# Patient Record
Sex: Male | Born: 1985 | Race: White | Hispanic: No | Marital: Single | Smoking: Former smoker
Health system: Southern US, Community
[De-identification: ages and names within clinical notes are randomized; demographics above are authoritative.]

## PROBLEM LIST (undated history)

## (undated) HISTORY — PX: ORBITAL FRACTURE SURGERY: SHX725

---

## 2014-09-08 ENCOUNTER — Emergency Department: Payer: Self-pay | Admitting: Emergency Medicine

## 2014-09-13 ENCOUNTER — Emergency Department: Payer: Self-pay | Admitting: Emergency Medicine

## 2014-11-26 ENCOUNTER — Ambulatory Visit
Admission: RE | Admit: 2014-11-26 | Discharge: 2014-11-26 | Disposition: A | Discharge status: Home / Self Care | Payer: Medicaid Other | Source: Ambulatory Visit | Attending: Internal Medicine | Admitting: Internal Medicine

## 2014-11-26 ENCOUNTER — Other Ambulatory Visit: Payer: Self-pay | Admitting: Internal Medicine

## 2014-11-26 DIAGNOSIS — R918 Other nonspecific abnormal finding of lung field: Secondary | ICD-10-CM

## 2014-11-30 ENCOUNTER — Other Ambulatory Visit: Payer: Self-pay | Admitting: Family Medicine

## 2014-11-30 DIAGNOSIS — R945 Abnormal results of liver function studies: Principal | ICD-10-CM

## 2014-11-30 DIAGNOSIS — R7989 Other specified abnormal findings of blood chemistry: Secondary | ICD-10-CM

## 2014-12-03 ENCOUNTER — Ambulatory Visit
Admission: RE | Admit: 2014-12-03 | Discharge: 2014-12-03 | Disposition: A | Discharge status: Home / Self Care | Payer: Medicaid Other | Source: Ambulatory Visit | Attending: Family Medicine | Admitting: Family Medicine

## 2014-12-03 DIAGNOSIS — R945 Abnormal results of liver function studies: Secondary | ICD-10-CM

## 2014-12-03 DIAGNOSIS — R109 Unspecified abdominal pain: Secondary | ICD-10-CM | POA: Insufficient documentation

## 2014-12-03 DIAGNOSIS — R7989 Other specified abnormal findings of blood chemistry: Secondary | ICD-10-CM | POA: Insufficient documentation

## 2014-12-22 ENCOUNTER — Emergency Department: Payer: Medicaid Other

## 2014-12-22 ENCOUNTER — Encounter: Payer: Self-pay | Admitting: Urgent Care

## 2014-12-22 DIAGNOSIS — Y998 Other external cause status: Secondary | ICD-10-CM | POA: Insufficient documentation

## 2014-12-22 DIAGNOSIS — Y9234 Swimming pool (public) as the place of occurrence of the external cause: Secondary | ICD-10-CM | POA: Insufficient documentation

## 2014-12-22 DIAGNOSIS — W231XXA Caught, crushed, jammed, or pinched between stationary objects, initial encounter: Secondary | ICD-10-CM | POA: Insufficient documentation

## 2014-12-22 DIAGNOSIS — Y9311 Activity, swimming: Secondary | ICD-10-CM | POA: Insufficient documentation

## 2014-12-22 DIAGNOSIS — S62645A Nondisplaced fracture of proximal phalanx of left ring finger, initial encounter for closed fracture: Secondary | ICD-10-CM | POA: Insufficient documentation

## 2014-12-22 DIAGNOSIS — Z720 Tobacco use: Secondary | ICD-10-CM | POA: Insufficient documentation

## 2014-12-22 NOTE — ED Notes (Signed)
Patient presents with c/o RIGHT hand pain. Patient reporting that he was swimming with his kid and one went under the water, states, "I dove in and I hit my hand. Now I cant bend it." (+) swelling.

## 2014-12-23 ENCOUNTER — Telehealth: Payer: Self-pay | Admitting: Family Medicine

## 2014-12-23 ENCOUNTER — Emergency Department
Admission: EM | Admit: 2014-12-23 | Discharge: 2014-12-23 | Disposition: A | Discharge status: Home / Self Care | Payer: Medicaid Other | Attending: Emergency Medicine | Admitting: Emergency Medicine

## 2014-12-23 ENCOUNTER — Encounter: Payer: Self-pay | Admitting: Urgent Care

## 2014-12-23 ENCOUNTER — Emergency Department
Admission: EM | Admit: 2014-12-23 | Discharge: 2014-12-23 | Disposition: A | Discharge status: Home / Self Care | Payer: Medicaid Other | Source: Home / Self Care | Attending: Emergency Medicine | Admitting: Emergency Medicine

## 2014-12-23 DIAGNOSIS — S62604A Fracture of unspecified phalanx of right ring finger, initial encounter for closed fracture: Secondary | ICD-10-CM

## 2014-12-23 DIAGNOSIS — T391X5A Adverse effect of 4-Aminophenol derivatives, initial encounter: Secondary | ICD-10-CM

## 2014-12-23 DIAGNOSIS — T50905A Adverse effect of unspecified drugs, medicaments and biological substances, initial encounter: Secondary | ICD-10-CM

## 2014-12-23 DIAGNOSIS — Z9119 Patient's noncompliance with other medical treatment and regimen: Secondary | ICD-10-CM

## 2014-12-23 DIAGNOSIS — Z789 Other specified health status: Secondary | ICD-10-CM

## 2014-12-23 DIAGNOSIS — Z720 Tobacco use: Secondary | ICD-10-CM

## 2014-12-23 DIAGNOSIS — S62609A Fracture of unspecified phalanx of unspecified finger, initial encounter for closed fracture: Secondary | ICD-10-CM

## 2014-12-23 MED ORDER — ONDANSETRON 8 MG PO TBDP
ORAL_TABLET | ORAL | Status: AC
  Administered 2014-12-23: 8 mg via ORAL
  Filled 2014-12-23: qty 1

## 2014-12-23 MED ORDER — ONDANSETRON 4 MG PO TBDP: 4.0000 mg | ORAL_TABLET | Freq: Three times a day (TID) | ORAL | Status: DC | PRN

## 2014-12-23 MED ORDER — GI COCKTAIL ~~LOC~~
30.0000 mL | Freq: Once | ORAL | Status: AC
  Administered 2014-12-23: 30 mL via ORAL

## 2014-12-23 MED ORDER — OXYCODONE-ACETAMINOPHEN 5-325 MG PO TABS
1.0000 | ORAL_TABLET | Freq: Once | ORAL | Status: AC
  Administered 2014-12-23: 1 via ORAL

## 2014-12-23 MED ORDER — OXYCODONE-ACETAMINOPHEN 5-325 MG PO TABS
ORAL_TABLET | ORAL | Status: AC
  Administered 2014-12-23: 1 via ORAL
  Filled 2014-12-23: qty 1

## 2014-12-23 MED ORDER — GI COCKTAIL ~~LOC~~
ORAL | Status: AC
  Filled 2014-12-23: qty 30

## 2014-12-23 MED ORDER — ONDANSETRON 8 MG PO TBDP
8.0000 mg | ORAL_TABLET | Freq: Once | ORAL | Status: AC
  Administered 2014-12-23: 8 mg via ORAL

## 2014-12-23 MED ORDER — OXYCODONE-ACETAMINOPHEN 5-325 MG PO TABS: 1.0000 | ORAL_TABLET | ORAL | Status: DC | PRN

## 2014-12-23 NOTE — ED Provider Notes (Signed)
[facility] Emergency Department Provider Note  ____________________________________________  Time seen: 6:40 AM  I have reviewed the triage vital signs and the nursing notes.   HISTORY  Chief Complaint Abdominal Pain      HPI Joseph Hayden is a 29 y.o. male presents with epigastric pain and one episodes of vomiting after taking Percocet     History reviewed. No pertinent past medical history.  There are no active problems to display for this patient.   Past Surgical History  Procedure Laterality Date  . Orbital fracture surgery Left     metal plate    Current Outpatient Rx  Name  Route  Sig  Dispense  Refill  . oxyCODONE-acetaminophen (PERCOCET/ROXICET) 5-325 MG per tablet   Oral   Take 1 tablet by mouth every 4 (four) hours as needed for severe pain.   12 tablet   0     Allergies Hydrocodone  No family history on file.  Social History History  Substance Use Topics  . Smoking status: Current Every Day Smoker  . Smokeless tobacco: Not on file  . Alcohol Use: Yes    Review of Systems  Constitutional: Negative for fever. Eyes: Negative for visual changes. ENT: Negative for sore throat. Cardiovascular: Negative for chest pain. Respiratory: Negative for shortness of breath. Gastrointestinal: Positive for abdominal pain, vomiting  Genitourinary: Negative for dysuria. Musculoskeletal: Negative for back pain. Skin: Negative for rash. Neurological: Negative for headaches, focal weakness or numbness.   10-point ROS otherwise negative.  ____________________________________________   PHYSICAL EXAM:  VITAL SIGNS: ED Triage Vitals  Enc Vitals Group     BP 12/23/14 0550 164/109 mmHg     Pulse Rate 12/23/14 0550 71     Resp --      Temp 12/23/14 0550 97.6 F (36.4 C)     Temp Source 12/23/14 0550 Oral     SpO2 12/23/14 0550 97 %     Weight 12/23/14 0550 230 lb (104.327 kg)     Height 12/23/14 0550 6\' 2"  (1.88 m)   Head Cir --      Peak Flow --      Pain Score 12/23/14 0550 10     Pain Loc --      Pain Edu? --      Excl. in GC? --      Constitutional: Alert and oriented. Well appearing and in no distress. Eyes: Conjunctivae are normal. PERRL. Normal extraocular movements. ENT   Head: Normocephalic and atraumatic.   Nose: No congestion/rhinnorhea.   Mouth/Throat: Mucous membranes are moist.   Neck: No stridor. Hematological/Lymphatic/Immunilogical: No cervical lymphadenopathy. Cardiovascular: Normal rate, regular rhythm. Normal and symmetric distal pulses are present in all extremities. No murmurs, rubs, or gallops. Respiratory: Normal respiratory effort without tachypnea nor retractions. Breath sounds are clear and equal bilaterally. No wheezes/rales/rhonchi. Gastrointestinal: Positive epigastric tenderness with palpation No distention. There is no CVA tenderness. Genitourinary: deferred Musculoskeletal: Nontender with normal range of motion in all extremities. No joint effusions.  No lower extremity tenderness nor edema. Neurologic:  Normal speech and language. No gross focal neurologic deficits are appreciated. Speech is normal.  Skin:  Skin is warm, dry and intact. No rash noted. Psychiatric: Mood and affect are normal. Speech and behavior are normal. Patient exhibits appropriate insight and judgment.  ____________________________________________        INITIAL IMPRESSION / ASSESSMENT AND PLAN / ED COURSE  Pertinent labs & imaging results that were available during my care of the patient were  reviewed by me and considered in my medical decision making (see chart for details).  History of physical exam consistent with possible GI upset secondary to medication (Percocet). Patient given GI cocktail in the emergency department improvement in symptoms  ____________________________________________   FINAL CLINICAL IMPRESSION(S) / ED DIAGNOSES  Final diagnoses:  Medication  intolerance      Darci Current, MD 12/23/14 [phone]

## 2014-12-23 NOTE — Telephone Encounter (Signed)
Pt. Was seen at [facility]  12/23/2014 with a fracture to the 4th finger on the right hand. He needs to get a referral to Ortho due to [employer] does he need to be seen?

## 2014-12-23 NOTE — Discharge Instructions (Signed)

## 2014-12-23 NOTE — ED Provider Notes (Signed)
[facility] Emergency Department Provider Note  ____________________________________________  Time seen: 3:00 AM  I have reviewed the triage vital signs and the nursing notes.   HISTORY  Chief Complaint Hand Pain     HPI Joseph Hayden is a 29 y.o. male presents with right hand pain currently 7 out of 10 and swelling since yesterday. Patient states he dove into the pole and struck his right hand on the bottom of the pool     History reviewed. No pertinent past medical history.  There are no active problems to display for this patient.   Past Surgical History  Procedure Laterality Date  . Orbital fracture surgery Left     metal plate    No current outpatient prescriptions on file.  Allergies Hydrocodone  No family history on file.  Social History History  Substance Use Topics  . Smoking status: Current Every Day Smoker  . Smokeless tobacco: Not on file  . Alcohol Use: Yes    Review of Systems  Constitutional: Negative for fever. Eyes: Negative for visual changes. ENT: Negative for sore throat. Cardiovascular: Negative for chest pain. Respiratory: Negative for shortness of breath. Gastrointestinal: Negative for abdominal pain, vomiting and diarrhea. Genitourinary: Negative for dysuria. Musculoskeletal: Negative for back pain. Skin: Negative for rash. Neurological: Negative for headaches, focal weakness or numbness.   10-point ROS otherwise negative.  ____________________________________________   PHYSICAL EXAM:  VITAL SIGNS: ED Triage Vitals  Enc Vitals Group     BP 12/22/14 2324 136/87 mmHg     Pulse Rate 12/22/14 2324 94     Resp 12/22/14 2324 18     Temp 12/22/14 2324 98.6 F (37 C)     Temp Source 12/22/14 2324 Oral     SpO2 12/22/14 2324 97 %     Weight 12/22/14 2324 230 lb (104.327 kg)     Height 12/22/14 2324 6\' 2"  (1.88 m)     Head Cir --      Peak Flow --      Pain Score 12/22/14 2325 8     Pain Loc --       Pain Edu? --      Excl. in GC? --      Constitutional: Alert and oriented. Well appearing and in no distress. Eyes: Conjunctivae are normal. PERRL. Normal extraocular movements. ENT   Head: Normocephalic and atraumatic.   Nose: No congestion/rhinnorhea.   Mouth/Throat: Mucous membranes are moist.   Neck: No stridor. Hematological/Lymphatic/Immunilogical: No cervical lymphadenopathy. Cardiovascular: Normal rate, regular rhythm. Normal and symmetric distal pulses are present in all extremities. No murmurs, rubs, or gallops. Respiratory: Normal respiratory effort without tachypnea nor retractions. Breath sounds are clear and equal bilaterally. No wheezes/rales/rhonchi. Gastrointestinal: Soft and nontender. No distention. There is no CVA tenderness. Genitourinary: deferred Musculoskeletal: Positive right fourth finger pain and swelling Neurologic:  Normal speech and language. No gross focal neurologic deficits are appreciated. Speech is normal.  Skin:  Skin is warm, dry and intact. No rash noted. Psychiatric: Mood and affect are normal. Speech and behavior are normal. Patient exhibits appropriate insight and judgment.  ____________________________________________      RADIOLOGY  Right hand x-ray revealed:  IMPRESSION: 1. Acute nondisplaced intra-articular fracture through the base of the right fourth middle phalanx with overlying soft tissue swelling. 2. Additional tiny osseous density at the base of the right fourth proximal phalanx, suspicious for additional tiny avulsion fracture. ____________________________________________   PROCEDURES  Procedure(s) performed: Aluminum and foam splint applied to the  patient's right fourth finger    ____________________________________________   INITIAL IMPRESSION / ASSESSMENT AND PLAN / ED COURSE  Pertinent labs & imaging results that were available during my care of the patient were reviewed by me and considered in  my medical decision making (see chart for details).  History physical exam and right hand x-ray consistent with right fourth finger fracture. Such aluminum foam splint applied to the right fourth finger patient will be referred to orthopedist  ____________________________________________   FINAL CLINICAL IMPRESSION(S) / ED DIAGNOSES  Final diagnoses:  Fracture of fourth finger, right, closed, initial encounter      Darci Current, MD 12/23/14 2359

## 2014-12-23 NOTE — Discharge Instructions (Signed)

## 2014-12-23 NOTE — ED Notes (Signed)
Patient presents with c/o stabbing pain to his upper abd. Of note, patient was seen here earlier and given Percocet. Patient questioning reaction. (+) N/V. Patient has had Percocet in the past without issues.

## 2015-02-02 ENCOUNTER — Encounter: Payer: Self-pay | Admitting: Family Medicine

## 2015-02-02 ENCOUNTER — Ambulatory Visit (INDEPENDENT_AMBULATORY_CARE_PROVIDER_SITE_OTHER): Payer: Self-pay | Admitting: Family Medicine

## 2015-02-02 VITALS — BP 140/82 | HR 80 | Ht 74.0 in | Wt 246.0 lb

## 2015-02-02 DIAGNOSIS — M545 Low back pain, unspecified: Secondary | ICD-10-CM

## 2015-02-02 DIAGNOSIS — E559 Vitamin D deficiency, unspecified: Secondary | ICD-10-CM

## 2015-02-02 DIAGNOSIS — M25561 Pain in right knee: Secondary | ICD-10-CM

## 2015-02-02 DIAGNOSIS — R7401 Elevation of levels of liver transaminase levels: Secondary | ICD-10-CM

## 2015-02-02 DIAGNOSIS — G8929 Other chronic pain: Secondary | ICD-10-CM

## 2015-02-02 DIAGNOSIS — H6983 Other specified disorders of Eustachian tube, bilateral: Secondary | ICD-10-CM

## 2015-02-02 DIAGNOSIS — S82002A Unspecified fracture of left patella, initial encounter for closed fracture: Secondary | ICD-10-CM

## 2015-02-02 DIAGNOSIS — R740 Nonspecific elevation of levels of transaminase and lactic acid dehydrogenase [LDH]: Secondary | ICD-10-CM

## 2015-02-02 MED ORDER — HYDROCODONE-ACETAMINOPHEN 5-325 MG PO TABS: 1.0000 | ORAL_TABLET | ORAL | Status: DC | PRN

## 2015-02-03 DIAGNOSIS — M25561 Pain in right knee: Secondary | ICD-10-CM

## 2015-02-03 DIAGNOSIS — M545 Low back pain, unspecified: Secondary | ICD-10-CM | POA: Insufficient documentation

## 2015-02-03 DIAGNOSIS — H6980 Other specified disorders of Eustachian tube, unspecified ear: Secondary | ICD-10-CM | POA: Insufficient documentation

## 2015-02-03 DIAGNOSIS — R740 Nonspecific elevation of levels of transaminase and lactic acid dehydrogenase [LDH]: Secondary | ICD-10-CM

## 2015-02-03 DIAGNOSIS — E559 Vitamin D deficiency, unspecified: Secondary | ICD-10-CM | POA: Insufficient documentation

## 2015-02-03 DIAGNOSIS — G8929 Other chronic pain: Secondary | ICD-10-CM | POA: Insufficient documentation

## 2015-02-03 DIAGNOSIS — R7401 Elevation of levels of liver transaminase levels: Secondary | ICD-10-CM | POA: Insufficient documentation

## 2015-02-03 NOTE — Progress Notes (Signed)
Date:  02/02/2015   Name:  Joseph Hayden   DOB:  07/13/1985   MRN:  [id number]  PCP:  Schuyler Amor, MD    Chief Complaint: Knee Pain   History of Present Illness:  This is a 29 y.o. male who reports sustaining medial L patella fx 01/19/15 while in [place] pit at concert in [place]. Seen next day and dx made on Xray, placed in knee immobilizer, still can't walk without immobilizer. Sees Dedra Skeens for finger injury, saw yesterday, says he needs referral to see for knee (confirmed with his office). Taking ibuprofen tid but not helping with pain, given hydrocodone at time of injury which helped and he tolerated well (allergy to hydrocodone listed). Also says Flonase NS has not helped his intermittent ear pain. Has frequent sneezing and watery eyes.  Review of Systems:  Review of Systems  Patient Active Problem List   Diagnosis Date Noted  . Vitamin D deficiency 02/03/2015  . ETD (eustachian tube dysfunction) 02/03/2015    Prior to Admission medications   Medication Sig Start Date End Date Taking? Authorizing Provider  ibuprofen (ADVIL,MOTRIN) 800 MG tablet Take 1 tablet by mouth every 8 (eight) hours as needed. 12/28/14  Yes Historical Provider, MD  traMADol (ULTRAM) 50 MG tablet Take 1 tablet by mouth every 6 (six) hours as needed. 01/27/15  Yes Historical Provider, MD  Cholecalciferol (VITAMIN D3) 5000 UNITS TABS Take 1 tablet by mouth daily.    Historical Provider, MD  HYDROcodone-acetaminophen (NORCO/VICODIN) 5-325 MG per tablet Take 1 tablet by mouth every 4 (four) hours as needed. 02/02/15   Schuyler Amor, MD  Turmeric POWD Take 1 tablet by mouth daily.    Historical Provider, MD    No Active Allergies  Past Surgical History  Procedure Laterality Date  . Orbital fracture surgery Left     metal plate    History  Substance Use Topics  . Smoking status: Former Games developer  . Smokeless tobacco: Not on file  . Alcohol Use: 0.0 oz/week    0 Standard drinks or equivalent per week     History reviewed. No pertinent family history.  Medication list has been reviewed and updated.  Physical Examination: BP 140/82 mmHg  Pulse 80  Ht  (1.88 m)  Wt 246 lb (111.585 kg)  BMI 31.57 kg/m2  Physical Exam  Constitutional: He appears well-developed and well-nourished. No distress.  HENT:  B TM's retracted, L > R  Musculoskeletal:  L knee in immobilizer    Assessment and Plan:  1. Fx patella, left, closed, initial encounter Persistent disability, refer ortho, Vicodin short-term only for pain - Ambulatory referral to Orthopedic Surgery - HYDROcodone-acetaminophen (NORCO/VICODIN) 5-325 MG per tablet; Take 1 tablet by mouth every 4 (four) hours as needed.  Dispense: 30 tablet; Refill: 0  2. ETD (eustachian tube dysfunction), bilateral Likely related to AR, trial OTC Claritin or Zyrtec for 2 weeks  3. Vitamin D deficiency Continue supplementation - Cholecalciferol (VITAMIN D3) 5000 UNITS TABS; Take 1 tablet by mouth daily.  4. Elevated ALT measurement Abdominal US negative, consider repeat next visit  5. Chronic low back pain  6. Chronic pain of right knee   No Follow-up on file.  [name]. Kingsley Spittle MD [facility]  02/03/2015

## 2015-02-09 ENCOUNTER — Other Ambulatory Visit: Payer: Self-pay | Admitting: Orthopedic Surgery

## 2015-02-09 DIAGNOSIS — M25562 Pain in left knee: Secondary | ICD-10-CM

## 2015-02-09 DIAGNOSIS — S8392XA Sprain of unspecified site of left knee, initial encounter: Secondary | ICD-10-CM

## 2015-02-09 DIAGNOSIS — M2392 Unspecified internal derangement of left knee: Secondary | ICD-10-CM

## 2015-02-15 ENCOUNTER — Ambulatory Visit: Payer: Medicaid Other

## 2015-03-26 ENCOUNTER — Emergency Department
Admission: EM | Admit: 2015-03-26 | Discharge: 2015-03-26 | Disposition: A | Discharge status: Home / Self Care | Payer: Self-pay | Attending: Student | Admitting: Student

## 2015-03-26 ENCOUNTER — Encounter: Payer: Self-pay | Admitting: Emergency Medicine

## 2015-03-26 DIAGNOSIS — Z87891 Personal history of nicotine dependence: Secondary | ICD-10-CM | POA: Insufficient documentation

## 2015-03-26 DIAGNOSIS — Y9389 Activity, other specified: Secondary | ICD-10-CM | POA: Insufficient documentation

## 2015-03-26 DIAGNOSIS — M792 Neuralgia and neuritis, unspecified: Secondary | ICD-10-CM | POA: Insufficient documentation

## 2015-03-26 DIAGNOSIS — S80912A Unspecified superficial injury of left knee, initial encounter: Secondary | ICD-10-CM | POA: Insufficient documentation

## 2015-03-26 DIAGNOSIS — Y998 Other external cause status: Secondary | ICD-10-CM | POA: Insufficient documentation

## 2015-03-26 DIAGNOSIS — W4909XA Other specified item causing external constriction, initial encounter: Secondary | ICD-10-CM | POA: Insufficient documentation

## 2015-03-26 DIAGNOSIS — Z79899 Other long term (current) drug therapy: Secondary | ICD-10-CM | POA: Insufficient documentation

## 2015-03-26 DIAGNOSIS — T300 Burn of unspecified body region, unspecified degree: Secondary | ICD-10-CM

## 2015-03-26 DIAGNOSIS — Y9289 Other specified places as the place of occurrence of the external cause: Secondary | ICD-10-CM | POA: Insufficient documentation

## 2015-03-26 MED ORDER — NYSTATIN-TRIAMCINOLONE 100000-0.1 UNIT/GM-% EX OINT: 1.0000 "application " | TOPICAL_OINTMENT | Freq: Two times a day (BID) | CUTANEOUS | Status: DC

## 2015-03-26 MED ORDER — PREDNISONE 10 MG (21) PO TBPK: ORAL_TABLET | ORAL | Status: DC

## 2015-03-26 NOTE — ED Provider Notes (Signed)
[facility] Emergency Department Provider Note ____________________________________________  Time seen: Approximately 12:11 PM  I have reviewed the triage vital signs and the nursing notes.   HISTORY  Chief Complaint Hand Pain   HPI Joseph Hayden is a 29 y.o. male who presents to the emergency department for evaluation of right hand pain. No specific injury. Symptoms started about 2 days ago and are worse. Described as a "shocking shooting" pain. No relief with ibuprofen. 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 He is also having left knee pain and wears a velcro brace daily. He now has a rash under the brace.  History reviewed. No pertinent past medical history.  Patient Active Problem List   Diagnosis Date Noted  . Vitamin D deficiency 02/03/2015  . ETD (eustachian tube dysfunction) 02/03/2015  . Elevated ALT measurement 02/03/2015  . Chronic low back pain 02/03/2015  . Chronic pain of right knee 02/03/2015    Past Surgical History  Procedure Laterality Date  . Orbital fracture surgery Left     metal plate    Current Outpatient Rx  Name  Route  Sig  Dispense  Refill  . Cholecalciferol (VITAMIN D3) 5000 UNITS TABS   Oral   Take 1 tablet by mouth daily.         Marland Kitchen HYDROcodone-acetaminophen (NORCO/VICODIN) 5-325 MG per tablet   Oral   Take 1 tablet by mouth every 4 (four) hours as needed.   30 tablet   0   . ibuprofen (ADVIL,MOTRIN) 800 MG tablet   Oral   Take 1 tablet by mouth every 8 (eight) hours as needed.         . traMADol (ULTRAM) 50 MG tablet   Oral   Take 1 tablet by mouth every 6 (six) hours as needed.         . Turmeric POWD   Oral   Take 1 tablet by mouth daily.           Allergies Vicodin  No family history on file.  Social History Social History  Substance Use Topics  . Smoking status: Former Games developer  . Smokeless tobacco: None  . Alcohol Use: 0.0 oz/week    0 Standard drinks or  equivalent per week    Review of Systems Constitutional: No recent illness. Eyes: No visual changes. ENT: No sore throat. Cardiovascular: Denies chest pain or palpitations. Respiratory: Denies shortness of breath. Gastrointestinal: No abdominal pain.  Genitourinary: Negative for dysuria. Musculoskeletal: Pain in right hand and left knee. Skin: Negative for rash. Neurological: Negative for headaches, focal weakness or numbness. 10-point ROS otherwise negative.  ____________________________________________   PHYSICAL EXAM:  VITAL SIGNS: ED Triage Vitals  Enc Vitals Group     BP 03/26/15 1149 153/90 mmHg     Pulse Rate 03/26/15 1149 76     Resp 03/26/15 1149 18     Temp 03/26/15 1149 97.5 F (36.4 C)     Temp Source 03/26/15 1149 Oral     SpO2 03/26/15 1149 97 %     Weight 03/26/15 1149 240 lb (108.863 kg)     Height 03/26/15 1149  (1.88 m)     Head Cir --      Peak Flow --      Pain Score 03/26/15 1149 7     Pain Loc --      Pain Edu? --      Excl. in GC? --     Constitutional:  Alert and oriented. Well appearing and in no acute distress. Eyes: Conjunctivae are normal. EOMI. Head: Atraumatic. Nose: No congestion/rhinnorhea. Neck: No stridor.  Respiratory: Normal respiratory effort.   Musculoskeletal: Grip strength decreased in the right hand 4/5; and left is 5/5. Normal ROM of right wrist without pain. Neurologic:  Normal speech and language. No gross focal neurologic deficits are appreciated. Speech is normal. No gait instability. 2 point discrimination normal. Skin:  Skin is warm, dry and intact. Atraumatic. Macular, erythematous area to the medial aspect of the left leg in the shape of the Velcro knee brace. Psychiatric: Mood and affect are normal. Speech and behavior are normal.  ____________________________________________   LABS (all labs ordered are listed, but only abnormal results are displayed)  Labs Reviewed - No data to  display ____________________________________________  RADIOLOGY  Not indicated. ____________________________________________   PROCEDURES  Procedure(s) performed: None   ____________________________________________   INITIAL IMPRESSION / ASSESSMENT AND PLAN / ED COURSE  Pertinent labs & imaging results that were available during my care of the patient were reviewed by me and considered in my medical decision making (see chart for details).  Patient was advised to take the prednisone as prescribed. He was advised to stop wearing the knee brace if possible. He was advised to purchase a different knee brace if he must have it at all. He was advised to keep the area as dry as possible. He will receive a prescription for nystatin/triamcinolone ointment to cover for any Candida however the area appears as friction burn from the brace itself. ____________________________________________   FINAL CLINICAL IMPRESSION(S) / ED DIAGNOSES  Final diagnoses:  Neuropathic pain  Friction burn       Chinita Pester, FNP 03/26/15 1446  Gayla Doss, MD 03/26/15 1530

## 2015-03-26 NOTE — Discharge Instructions (Signed)
Please change your knee brace to something that is more breathable. Remove the brace as often as possible. You should discuss the knee issues with orthopedics. Return to the ER for symptoms that change or worsen if unable to schedule an appointment.

## 2015-03-26 NOTE — ED Notes (Signed)
White spot noted on each finger and tips reddened, denies being aware of injury but states does work on [employer].

## 2015-04-02 ENCOUNTER — Emergency Department
Admission: EM | Admit: 2015-04-02 | Discharge: 2015-04-02 | Disposition: A | Discharge status: Home / Self Care | Payer: Self-pay | Attending: Emergency Medicine | Admitting: Emergency Medicine

## 2015-04-02 ENCOUNTER — Encounter: Payer: Self-pay | Admitting: Emergency Medicine

## 2015-04-02 DIAGNOSIS — L02413 Cutaneous abscess of right upper limb: Secondary | ICD-10-CM | POA: Insufficient documentation

## 2015-04-02 DIAGNOSIS — Z79899 Other long term (current) drug therapy: Secondary | ICD-10-CM | POA: Insufficient documentation

## 2015-04-02 DIAGNOSIS — Z87891 Personal history of nicotine dependence: Secondary | ICD-10-CM | POA: Insufficient documentation

## 2015-04-02 DIAGNOSIS — L03113 Cellulitis of right upper limb: Secondary | ICD-10-CM | POA: Insufficient documentation

## 2015-04-02 DIAGNOSIS — L0291 Cutaneous abscess, unspecified: Secondary | ICD-10-CM

## 2015-04-02 LAB — CBC
HEMATOCRIT: 44.9 % (ref 40.0–52.0)
HEMOGLOBIN: 15.5 g/dL (ref 13.0–18.0)
MCH: 32.5 pg (ref 26.0–34.0)
MCHC: 34.6 g/dL (ref 32.0–36.0)
MCV: 94.2 fL (ref 80.0–100.0)
Platelets: 211 10*3/uL (ref 150–440)
RBC: 4.76 MIL/uL (ref 4.40–5.90)
RDW: 12.9 % (ref 11.5–14.5)
WBC: 15.7 10*3/uL — ABNORMAL HIGH (ref 3.8–10.6)

## 2015-04-02 LAB — BASIC METABOLIC PANEL
ANION GAP: 11 (ref 5–15)
BUN: 17 mg/dL (ref 6–20)
CO2: 24 mmol/L (ref 22–32)
Calcium: 9.5 mg/dL (ref 8.9–10.3)
Chloride: 103 mmol/L (ref 101–111)
Creatinine, Ser: 0.9 mg/dL (ref 0.61–1.24)
GFR calc non Af Amer: >60 mL/min (ref >60)
Glucose, Bld: 90 mg/dL (ref 65–99)
POTASSIUM: 4 mmol/L (ref 3.5–5.1)
Sodium: 138 mmol/L (ref 135–145)

## 2015-04-02 MED ORDER — LIDOCAINE HCL (PF) 1 % IJ SOLN
INTRAMUSCULAR | Status: AC
  Filled 2015-04-02: qty 5

## 2015-04-02 MED ORDER — SULFAMETHOXAZOLE-TRIMETHOPRIM 800-160 MG PO TABS: 2.0000 | ORAL_TABLET | Freq: Two times a day (BID) | ORAL | Status: AC

## 2015-04-02 MED ORDER — CLINDAMYCIN PHOSPHATE 600 MG/50ML IV SOLN
600.0000 mg | Freq: Once | INTRAVENOUS | Status: AC
  Administered 2015-04-02: 600 mg via INTRAVENOUS
  Filled 2015-04-02: qty 50

## 2015-04-02 MED ORDER — OXYCODONE-ACETAMINOPHEN 5-325 MG PO TABS
1.0000 | ORAL_TABLET | Freq: Once | ORAL | Status: AC
  Administered 2015-04-02: 1 via ORAL
  Filled 2015-04-02: qty 1

## 2015-04-02 NOTE — ED Provider Notes (Signed)
[facility] Emergency Department Provider Note  ____________________________________________  Time seen: Approximately 4:21 AM  I have reviewed the triage vital signs and the nursing notes.   HISTORY  Chief Complaint Abscess    HPI Joseph Hayden is a 29 y.o. male who comes in today with a right forearm abscess. The patient reports it has been there for 2 days and it is hot and painful. He reports it is throbbing and worse if you touch it. The patient reports that he felt hot earlier today and yesterday but never took his temperature. The patient reports he has never had any of these things happen before. The patient is unsure if he has hurt himself or had any injury in the last few days. The patientreports that his pain is a 7 out of 10 in intensity. The patient denies any drainage of the area. He did not try to apply any warm compresses. He reports that he felt it would: On its own but has been getting bigger and bigger without any resolution. No nausea or vomiting   History reviewed. No pertinent past medical history.  Patient Active Problem List   Diagnosis Date Noted  . Vitamin D deficiency 02/03/2015  . ETD (eustachian tube dysfunction) 02/03/2015  . Elevated ALT measurement 02/03/2015  . Chronic low back pain 02/03/2015  . Chronic pain of right knee 02/03/2015    Past Surgical History  Procedure Laterality Date  . Orbital fracture surgery Left     metal plate    Current Outpatient Rx  Name  Route  Sig  Dispense  Refill  . Cholecalciferol (VITAMIN D3) 5000 UNITS TABS   Oral   Take 1 tablet by mouth daily.         . GuaiFENesin (HERBAL EXPEC PO)   Oral   Take by mouth.         Marland Kitchen ibuprofen (ADVIL,MOTRIN) 800 MG tablet   Oral   Take 1 tablet by mouth every 8 (eight) hours as needed.         . nystatin-triamcinolone ointment (MYCOLOG)   Topical   Apply 1 application topically 2 (two) times daily.   30 g   0   . predniSONE  (STERAPRED UNI-PAK 21 TAB) 10 MG (21) TBPK tablet      Take 6 tablets on day 1 Take 5 tablets on day 2 Take 4 tablets on day 3 Take 3 tablets on day 4 Take 2 tablets on day 5 Take 1 tablet on day 6   21 tablet   0   . traMADol (ULTRAM) 50 MG tablet   Oral   Take 1 tablet by mouth every 6 (six) hours as needed.         . Turmeric POWD   Oral   Take 1 tablet by mouth daily.         Marland Kitchen sulfamethoxazole-trimethoprim (BACTRIM DS,SEPTRA DS) 800-160 MG tablet   Oral   Take 2 tablets by mouth 2 (two) times daily.   28 tablet   0     Allergies Vicodin  No family history on file.  Social History Social History  Substance Use Topics  . Smoking status: Former Games developer  . Smokeless tobacco: None  . Alcohol Use: 0.0 oz/week    0 Standard drinks or equivalent per week    Review of Systems Constitutional: Feverish Eyes: No visual changes. ENT: No sore throat. Cardiovascular: Denies chest pain. Respiratory: Denies shortness of breath. Gastrointestinal: No abdominal pain.  No  nausea, no vomiting.  No diarrhea.  No constipation. Genitourinary: Negative for dysuria. Musculoskeletal: Negative for back pain. Skin: Abscess to right forearm Neurological: Negative for headaches, focal weakness or numbness.  10-point ROS otherwise negative.  ____________________________________________   PHYSICAL EXAM:  VITAL SIGNS: ED Triage Vitals  Enc Vitals Group     BP 04/02/15 0144 136/82 mmHg     Pulse Rate 04/02/15 0144 103     Resp 04/02/15 0144 20     Temp 04/02/15 0144 98.4 F (36.9 C)     Temp Source 04/02/15 0144 Oral     SpO2 04/02/15 0144 97 %     Weight 04/02/15 0144 240 lb (108.863 kg)     Height 04/02/15 0144  (1.88 m)     Head Cir --      Peak Flow --      Pain Score 04/02/15 0145 8     Pain Loc --      Pain Edu? --      Excl. in GC? --     Constitutional: Alert and oriented. Well appearing and in moderate distress. Eyes: Conjunctivae are normal. PERRL.  EOMI. Head: Atraumatic. Nose: No congestion/rhinnorhea. Mouth/Throat: Mucous membranes are moist.  Oropharynx non-erythematous. Cardiovascular: Normal rate, regular rhythm. Grossly normal heart sounds.  Good peripheral circulation. Respiratory: Normal respiratory effort.  No retractions. Lungs CTAB. Gastrointestinal: Soft and nontender. No distention. Positive bowel sounds Musculoskeletal: No lower extremity tenderness nor edema.   Neurologic:  Normal speech and language. No gross focal neurologic deficits are appreciated. No gait instability. Skin:  Large area of erythema and induration to right forearm on the dorsal surface. No significant fluctuance to the area. Tender to palpation and raised area approximately 3-4 cm in diameter. Psychiatric: Mood and affect are normal.   ____________________________________________   LABS (all labs ordered are listed, but only abnormal results are displayed)  Labs Reviewed  CBC - Abnormal; Notable for the following:    WBC 15.7 (*)    All other components within normal limits  CULTURE, BLOOD (ROUTINE X 2)  CULTURE, BLOOD (ROUTINE X 2)  BASIC METABOLIC PANEL   ____________________________________________  EKG  None ____________________________________________  RADIOLOGY  None ____________________________________________   PROCEDURES  Procedure(s) performed: Please, see procedure note(s).  INCISION AND DRAINAGE Performed by: Lucrezia Europe P Consent: Verbal consent obtained. Risks and benefits: risks, benefits and alternatives were discussed Type: abscess  Body area: right forearm  Anesthesia: local infiltration  Incision was made with a scalpel.  Local anesthetic: lidocaine 1% without epinephrine  Anesthetic total: 3 ml  Complexity: complex Blunt dissection to break up loculations  Drainage: purulent  Drainage amount: moderate  Packing material: 1/4 in iodoform gauze  Patient tolerance: Patient tolerated the  procedure well with no immediate complications.     Critical Care performed: No  ____________________________________________   INITIAL IMPRESSION / ASSESSMENT AND PLAN / ED COURSE  Pertinent labs & imaging results that were available during my care of the patient were reviewed by me and considered in my medical decision making (see chart for details).  This is a 29 year old male who comes in today with a large abscess to his right forearm. I initially started attempting to incise the abscess without much success but then made a bigger incision and was able to extract some purulent material from the abscess. The patient did receive a dose of clindamycin IV and will be sent home with Bactrim as it is more cost effective. The patient also received  2 doses of Percocet while in the emergency department. The patient had no further complaints or concerns on the emergency department will be discharged home. The patient should have his abscess reevaluated in 2 days. ____________________________________________   FINAL CLINICAL IMPRESSION(S) / ED DIAGNOSES  Final diagnoses:  Abscess  Cellulitis of right upper extremity      Rebecka Apley, MD 04/02/15 [phone]

## 2015-04-02 NOTE — ED Notes (Signed)
Patient reports noted area to right forearm that is red and swollen.  Patient reports unsure what happened.

## 2015-04-02 NOTE — Discharge Instructions (Signed)

## 2015-04-05 ENCOUNTER — Emergency Department
Admission: EM | Admit: 2015-04-05 | Discharge: 2015-04-05 | Disposition: A | Discharge status: Home / Self Care | Payer: Self-pay | Attending: Student | Admitting: Student

## 2015-04-05 ENCOUNTER — Encounter: Payer: Self-pay | Admitting: Emergency Medicine

## 2015-04-05 DIAGNOSIS — Z87891 Personal history of nicotine dependence: Secondary | ICD-10-CM | POA: Insufficient documentation

## 2015-04-05 DIAGNOSIS — L02413 Cutaneous abscess of right upper limb: Secondary | ICD-10-CM | POA: Insufficient documentation

## 2015-04-05 DIAGNOSIS — Z79899 Other long term (current) drug therapy: Secondary | ICD-10-CM | POA: Insufficient documentation

## 2015-04-05 MED ORDER — TRAMADOL HCL 50 MG PO TABS
50.0000 mg | ORAL_TABLET | Freq: Four times a day (QID) | ORAL | Status: DC | PRN
Start: 2015-04-05 — End: 2015-11-19

## 2015-04-05 NOTE — ED Provider Notes (Signed)
CSN: [id number]     Arrival date & time 04/05/15  1455 History   First MD Initiated Contact with Patient 04/05/15 1531     Chief Complaint  Patient presents with  . Dressing Change    HPI Comments: 29 year old male presents today for wound check for right forearm abscess. He was seen here on October 1 by Dr. Zenda Alpers and had the abscess drained. Packing was placed and is still in the wound. He is been taking over-the-counter medicines for pain without relief. Unfortunately he is allergic to Vicodin. He is asking for something for pain. No fevers, pain is decreased since the incision and drainage. He has a history of recurrent abscesses and MRSA.   History reviewed. No pertinent past medical history. Past Surgical History  Procedure Laterality Date  . Orbital fracture surgery Left     metal plate   No family history on file. Social History  Substance Use Topics  . Smoking status: Former Smoker    Quit date: 10/03/2013  . Smokeless tobacco: None  . Alcohol Use: 3.6 oz/week    6 Standard drinks or equivalent per week    Review of Systems  Constitutional: Negative for fever and chills.  Skin: Positive for wound.  All other systems reviewed and are negative.     Allergies  Vicodin  Home Medications   Prior to Admission medications   Medication Sig Start Date End Date Taking? Authorizing Provider  Cholecalciferol (VITAMIN D3) 5000 UNITS TABS Take 1 tablet by mouth daily.    Historical Provider, MD  GuaiFENesin (HERBAL EXPEC PO) Take by mouth.    Historical Provider, MD  ibuprofen (ADVIL,MOTRIN) 800 MG tablet Take 1 tablet by mouth every 8 (eight) hours as needed. 12/28/14   Historical Provider, MD  nystatin-triamcinolone ointment (MYCOLOG) Apply 1 application topically 2 (two) times daily. 03/26/15   Chinita Pester, FNP  predniSONE (STERAPRED UNI-PAK 21 TAB) 10 MG (21) TBPK tablet Take 6 tablets on day 1 Take 5 tablets on day 2 Take 4 tablets on day 3 Take 3 tablets on day  4 Take 2 tablets on day 5 Take 1 tablet on day 6 03/26/15   Cari B Triplett, FNP  sulfamethoxazole-trimethoprim (BACTRIM DS,SEPTRA DS) 800-160 MG tablet Take 2 tablets by mouth 2 (two) times daily. 04/02/15 04/08/15  Rebecka Apley, MD  traMADol (ULTRAM) 50 MG tablet Take 1 tablet (50 mg total) by mouth every 6 (six) hours as needed. 04/05/15 04/04/16  Luvenia Redden, PA-C  Turmeric POWD Take 1 tablet by mouth daily.    Historical Provider, MD   BP 136/70 mmHg  Pulse 70  Temp(Src) 97.8 F (36.6 C) (Oral)  Resp 16  Ht  (1.88 m)  Wt 240 lb (108.863 kg)  BMI 30.80 kg/m2  SpO2 98% Physical Exam  Constitutional: He is oriented to person, place, and time. Vital signs are normal. He appears well-developed and well-nourished. He is active.  Non-toxic appearance. He does not have a sickly appearance. He does not appear ill.  Neurological: He is alert and oriented to person, place, and time.  Skin: Skin is warm and dry.  Right forearm with open wound, packing in place. No surrounding erythema. Tender to palpation. No purulent drainage.  Psychiatric: He has a normal mood and affect. His behavior is normal. Judgment and thought content normal.  Nursing note and vitals reviewed.   ED Course  Procedures (including critical care time) Labs Review Labs Reviewed - No data to display  Imaging Review No results found. I have personally reviewed and evaluated these images and lab results as part of my medical decision-making.   EKG Interpretation None      MDM  I reviewed the packing from the wound and replaced it with iodoform gauze. Covered wound with sterile 4 x 4. I advised to return in 2 days for another wound check. He will try to schedule this with his primary care doctor and if he is unable will come back to see [place]. Finish antibiotic course as directed. Short course of tramadol given for patient to try. Final diagnoses:  Abscess of forearm, right        Luvenia Redden,  PA-C 04/05/15 1807  Gayla Doss, MD 04/05/15 2034

## 2015-04-05 NOTE — Discharge Instructions (Signed)
Abscess °An abscess (boil or furuncle) is an infected area on or under the skin. This area is filled with yellowish-white fluid (pus) and other material (debris). °HOME CARE  °· Only take medicines as told by your doctor. °· If you were given antibiotic medicine, take it as directed. Finish the medicine even if you start to feel better. °· If gauze is used, follow your doctor's directions for changing the gauze. °· To avoid spreading the infection: °¨ Keep your abscess covered with a bandage. °¨ Wash your hands well. °¨ Do not share personal care items, towels, or whirlpools with others. °¨ Avoid skin contact with others. °· Keep your skin and clothes clean around the abscess. °· Keep all doctor visits as told. °GET HELP RIGHT AWAY IF:  °· You have more pain, puffiness (swelling), or redness in the wound site. °· You have more fluid or blood coming from the wound site. °· You have muscle aches, chills, or you feel sick. °· You have a fever. °MAKE SURE YOU:  °· Understand these instructions. °· Will watch your condition. °· Will get help right away if you are not doing well or get worse. °Document Released: 12/05/2007 Document Revised: 12/18/2011 Document Reviewed: 08/31/2011 °[employer]® Patient Information ©2015 [employer]. This information is not intended to replace advice given to you by your health care provider. Make sure you discuss any questions you have with your health care provider. ° °

## 2015-04-05 NOTE — ED Notes (Signed)
Patient presents to the ED with abscess to right forearm that was opened and packed in the ED on Saturday with instructions to have it repacked within 48 hours.  Patient was unable to get an appointment at his PCP.  No obvious distress at this time.

## 2015-04-07 LAB — CULTURE, BLOOD (ROUTINE X 2)
CULTURE: NO GROWTH
Culture: NO GROWTH

## 2015-04-12 ENCOUNTER — Emergency Department
Admission: EM | Admit: 2015-04-12 | Discharge: 2015-04-12 | Disposition: A | Discharge status: Home / Self Care | Payer: Self-pay | Attending: Emergency Medicine | Admitting: Emergency Medicine

## 2015-04-12 ENCOUNTER — Encounter: Payer: Self-pay | Admitting: Emergency Medicine

## 2015-04-12 DIAGNOSIS — Z5189 Encounter for other specified aftercare: Secondary | ICD-10-CM

## 2015-04-12 DIAGNOSIS — G8929 Other chronic pain: Secondary | ICD-10-CM | POA: Insufficient documentation

## 2015-04-12 DIAGNOSIS — Z4801 Encounter for change or removal of surgical wound dressing: Secondary | ICD-10-CM | POA: Insufficient documentation

## 2015-04-12 DIAGNOSIS — Z87891 Personal history of nicotine dependence: Secondary | ICD-10-CM | POA: Insufficient documentation

## 2015-04-12 DIAGNOSIS — Z79899 Other long term (current) drug therapy: Secondary | ICD-10-CM | POA: Insufficient documentation

## 2015-04-12 NOTE — ED Notes (Signed)
Pt states he had packing placed to wound on right forearm and needs to have packing removed today

## 2015-04-12 NOTE — ED Provider Notes (Signed)
[facility] Emergency Department Provider Note  ____________________________________________  Time seen: Approximately 1:32 PM  I have reviewed the triage vital signs and the nursing notes.   HISTORY  Chief Complaint Wound Check    HPI Joseph Hayden is a 29 y.o. male patient here today for wound check secondary to an abscess which was I&D the right forearm. This apparently the patient's second wound check. He'll determine the last visit that he will need to be still packed and is here today for packing evaluation removal. Patient state there is continued pain but easily control with the medications given. Patient denies any fever associated with this complaint. Patient denies any active discharge with this complaint. Patient is rating his pain as a 5/10 described as dull.   History reviewed. No pertinent past medical history.  Patient Active Problem List   Diagnosis Date Noted  . Vitamin D deficiency 02/03/2015  . ETD (eustachian tube dysfunction) 02/03/2015  . Elevated ALT measurement 02/03/2015  . Chronic low back pain 02/03/2015  . Chronic pain of right knee 02/03/2015    Past Surgical History  Procedure Laterality Date  . Orbital fracture surgery Left     metal plate    Current Outpatient Rx  Name  Route  Sig  Dispense  Refill  . Cholecalciferol (VITAMIN D3) 5000 UNITS TABS   Oral   Take 1 tablet by mouth daily.         . GuaiFENesin (HERBAL EXPEC PO)   Oral   Take by mouth.         Marland Kitchen ibuprofen (ADVIL,MOTRIN) 800 MG tablet   Oral   Take 1 tablet by mouth every 8 (eight) hours as needed.         . nystatin-triamcinolone ointment (MYCOLOG)   Topical   Apply 1 application topically 2 (two) times daily.   30 g   0   . predniSONE (STERAPRED UNI-PAK 21 TAB) 10 MG (21) TBPK tablet      Take 6 tablets on day 1 Take 5 tablets on day 2 Take 4 tablets on day 3 Take 3 tablets on day 4 Take 2 tablets on day 5 Take 1 tablet on day  6   21 tablet   0   . traMADol (ULTRAM) 50 MG tablet   Oral   Take 1 tablet (50 mg total) by mouth every 6 (six) hours as needed.   20 tablet   0   . Turmeric POWD   Oral   Take 1 tablet by mouth daily.           Allergies Vicodin  No family history on file.  Social History Social History  Substance Use Topics  . Smoking status: Former Smoker    Quit date: 10/03/2013  . Smokeless tobacco: None  . Alcohol Use: 3.6 oz/week    6 Standard drinks or equivalent per week    Review of Systems Constitutional: No fever/chills Eyes: No visual changes. ENT: No sore throat. Cardiovascular: Denies chest pain. Respiratory: Denies shortness of breath. Gastrointestinal: No abdominal pain.  No nausea, no vomiting.  No diarrhea.  No constipation. Genitourinary: Negative for dysuria. Musculoskeletal: Negative for back pain. Skin: Negative for rash. Healing abscess Neurological: Negative for headaches, focal weakness or numbness.  10-point ROS otherwise negative.  ____________________________________________   PHYSICAL EXAM:  VITAL SIGNS: ED Triage Vitals  Enc Vitals Group     BP 04/12/15 1301 147/76 mmHg     Pulse Rate 04/12/15 1301 60  Resp 04/12/15 1301 18     Temp 04/12/15 1301 98.2 F (36.8 C)     Temp Source 04/12/15 1301 Oral     SpO2 04/12/15 1301 96 %     Weight 04/12/15 1301 240 lb (108.863 kg)     Height 04/12/15 1301  (1.88 m)     Head Cir --      Peak Flow --      Pain Score 04/12/15 1301 5     Pain Loc --      Pain Edu? --      Excl. in GC? --     Constitutional: Alert and oriented. Well appearing and in no acute distress. Eyes: Conjunctivae are normal. PERRL. EOMI. Head: Atraumatic. Nose: No congestion/rhinnorhea. Mouth/Throat: Mucous membranes are moist.  Oropharynx non-erythematous. Neck: No stridor. No cervical spine tenderness to palpation. Hematological/Lymphatic/Immunilogical: No cervical lymphadenopathy. Cardiovascular: Normal  rate, regular rhythm. Grossly normal heart sounds.  Good peripheral circulation. Respiratory: Normal respiratory effort.  No retractions. Lungs CTAB. Gastrointestinal: Soft and nontender. No distention. No abdominal bruits. No CVA tenderness. Musculoskeletal: No lower extremity tenderness nor edema.  No joint effusions. Neurologic:  Normal speech and language. No gross focal neurologic deficits are appreciated. No gait instability. Skin:  Skin is warm, dry and intact. No rash noted. Mild edema no erythema surrounding the abscess area of the right forearm. Neurovascular intact free nuchal range of motion. Psychiatric: Mood and affect are normal. Speech and behavior are normal.  ____________________________________________   LABS (all labs ordered are listed, but only abnormal results are displayed)  Labs Reviewed - No data to display ____________________________________________  EKG   ____________________________________________  RADIOLOGY   ____________________________________________   PROCEDURES  Procedure(s) performed: None  Critical Care performed: No  ____________________________________________   INITIAL IMPRESSION / ASSESSMENT AND PLAN / ED COURSE  Pertinent labs & imaging results that were available during my care of the patient were reviewed by me and considered in my medical decision making (see chart for details).  Healing abscess right forearm. Packing material was removed area was irrigated with clear return. Area was then cleaned and bandaged. Patient discharge instructions on wound care. Advised to continue and complete his anabiotic therapy. Return to ER if condition worsens. ____________________________________________   FINAL CLINICAL IMPRESSION(S) / ED DIAGNOSES  Final diagnoses:  Admission for wound check of abscess      Joni Reining, PA-C 04/12/15 1335  Jene Every, MD 04/12/15 1413

## 2015-04-12 NOTE — ED Notes (Signed)
Clean dry dressing placed by PA after packing removed.

## 2015-09-01 ENCOUNTER — Emergency Department
Admission: EM | Admit: 2015-09-01 | Discharge: 2015-09-01 | Disposition: A | Discharge status: Home / Self Care | Payer: Self-pay | Attending: Emergency Medicine | Admitting: Emergency Medicine

## 2015-09-01 ENCOUNTER — Encounter: Payer: Self-pay | Admitting: Emergency Medicine

## 2015-09-01 DIAGNOSIS — L03114 Cellulitis of left upper limb: Secondary | ICD-10-CM | POA: Insufficient documentation

## 2015-09-01 DIAGNOSIS — L03811 Cellulitis of head [any part, except face]: Secondary | ICD-10-CM | POA: Insufficient documentation

## 2015-09-01 DIAGNOSIS — Z79899 Other long term (current) drug therapy: Secondary | ICD-10-CM | POA: Insufficient documentation

## 2015-09-01 DIAGNOSIS — L02811 Cutaneous abscess of head [any part, except face]: Secondary | ICD-10-CM | POA: Insufficient documentation

## 2015-09-01 DIAGNOSIS — Z87891 Personal history of nicotine dependence: Secondary | ICD-10-CM | POA: Insufficient documentation

## 2015-09-01 MED ORDER — LIDOCAINE-EPINEPHRINE (PF) 1 %-1:200000 IJ SOLN
30.0000 mL | Freq: Once | INTRAMUSCULAR | Status: AC
  Administered 2015-09-01: 30 mL via INTRADERMAL
  Filled 2015-09-01: qty 30

## 2015-09-01 MED ORDER — SULFAMETHOXAZOLE-TRIMETHOPRIM 800-160 MG PO TABS
1.0000 | ORAL_TABLET | Freq: Once | ORAL | Status: AC
  Administered 2015-09-01: 1 via ORAL
  Filled 2015-09-01: qty 1

## 2015-09-01 MED ORDER — SULFAMETHOXAZOLE-TRIMETHOPRIM 800-160 MG PO TABS: 1.0000 | ORAL_TABLET | Freq: Two times a day (BID) | ORAL | Status: DC

## 2015-09-01 MED ORDER — TRAMADOL HCL 50 MG PO TABS: 50.0000 mg | ORAL_TABLET | Freq: Two times a day (BID) | ORAL | Status: DC

## 2015-09-01 NOTE — ED Provider Notes (Signed)
[facility] Emergency Department Provider Note ____________________________________________  Time seen: 0715  I have reviewed the triage vital signs and the nursing notes.  HISTORY  Chief Complaint  Abscess  HPI Joseph Hayden is a 30 y.o. male visits to the ED for evaluation of a boil to the scalpthat is present for approximately 4 days. Patient does admit to squeezing the focal area a few days prior, before it began to get extremely tight, red, and sore. He denies any spontaneous drainage, but does report some subjective fevers in the last 24 hours. He also reports some pain to the scalp and head. He denies any dizziness, nausea, or vomiting. He rates his pain at a 7/10 in triage.  History reviewed. No pertinent past medical history.  Patient Active Problem List   Diagnosis Date Noted  . Vitamin D deficiency 02/03/2015  . ETD (eustachian tube dysfunction) 02/03/2015  . Elevated ALT measurement 02/03/2015  . Chronic low back pain 02/03/2015  . Chronic pain of right knee 02/03/2015    Past Surgical History  Procedure Laterality Date  . Orbital fracture surgery Left     metal plate    Current Outpatient Rx  Name  Route  Sig  Dispense  Refill  . Cholecalciferol (VITAMIN D3) 5000 UNITS TABS   Oral   Take 1 tablet by mouth daily.         . GuaiFENesin (HERBAL EXPEC PO)   Oral   Take by mouth.         Marland Kitchen ibuprofen (ADVIL,MOTRIN) 800 MG tablet   Oral   Take 1 tablet by mouth every 8 (eight) hours as needed.         . nystatin-triamcinolone ointment (MYCOLOG)   Topical   Apply 1 application topically 2 (two) times daily.   30 g   0   . predniSONE (STERAPRED UNI-PAK 21 TAB) 10 MG (21) TBPK tablet      Take 6 tablets on day 1 Take 5 tablets on day 2 Take 4 tablets on day 3 Take 3 tablets on day 4 Take 2 tablets on day 5 Take 1 tablet on day 6   21 tablet   0   . sulfamethoxazole-trimethoprim (BACTRIM DS,SEPTRA DS) 800-160 MG tablet  Oral   Take 1 tablet by mouth 2 (two) times daily.   19 tablet   0   . traMADol (ULTRAM) 50 MG tablet   Oral   Take 1 tablet (50 mg total) by mouth every 6 (six) hours as needed.   20 tablet   0   . traMADol (ULTRAM) 50 MG tablet   Oral   Take 1 tablet (50 mg total) by mouth 2 (two) times daily.   10 tablet   0   . Turmeric POWD   Oral   Take 1 tablet by mouth daily.          Allergies Vicodin  No family history on file.  Social History Social History  Substance Use Topics  . Smoking status: Former Smoker    Quit date: 10/03/2013  . Smokeless tobacco: None  . Alcohol Use: No    Review of Systems  Constitutional: Negative for fever. Eyes: Negative for visual changes. ENT: Negative for sore throat. Cardiovascular: Negative for chest pain. Respiratory: Negative for shortness of breath. Gastrointestinal: Negative for abdominal pain, vomiting and diarrhea. Genitourinary: Negative for dysuria. Musculoskeletal: Negative for back pain. Skin: Negative for rash. Neurological: Negative for headaches, focal weakness or numbness. ____________________________________________  PHYSICAL  EXAM:  VITAL SIGNS: ED Triage Vitals  Enc Vitals Group     BP 09/01/15 0706 131/72 mmHg     Pulse Rate 09/01/15 0706 78     Resp 09/01/15 0706 18     Temp 09/01/15 0706 98.1 F (36.7 C)     Temp Source 09/01/15 0706 Oral     SpO2 09/01/15 0706 99 %     Weight 09/01/15 0706 210 lb (95.255 kg)     Height 09/01/15 0706  (1.88 m)     Head Cir --      Peak Flow --      Pain Score 09/01/15 0706 7     Pain Loc --      Pain Edu? --      Excl. in GC? --    Constitutional: Alert and oriented. Well appearing and in no distress. Head: Normocephalic and atraumatic. Posterior scalp with a 2 cm localized cellulitic nodule with surrounding erythema. Overlying scab is noted.  Eyes: Conjunctivae are normal. PERRL. Normal extraocular movements Hematological/Lymphatic/Immunological: No  cervical lymphadenopathy. Cardiovascular: Normal rate, regular rhythm.  Respiratory: Normal respiratory effort.  Musculoskeletal: Nontender with normal range of motion in all extremities.  Neurologic:  Normal gait without ataxia. Normal speech and language. No gross focal neurologic deficits are appreciated. Skin:  Skin is warm, dry and intact. No rash noted. Small local area of cellulitis to the left shoulder.  Psychiatric: Mood and affect are normal. Patient exhibits appropriate insight and judgment. ____________________________________________  PROCEDURES  Bactrim DS 1 PO  INCISION AND DRAINAGE Performed by: Lissa Hoard Consent: Verbal consent obtained. Risks and benefits: risks, benefits and alternatives were discussed Type: abscess  Body area: posterior scalp  Anesthesia: local infiltration  Incision was made with a scalpel.  Local anesthetic: lidocaine 1% w/ epinephrine  Anesthetic total: 2 ml  Complexity: complex Blunt dissection to break up loculations  Drainage: purulent  Drainage amount: moderate  Patient tolerance: Patient tolerated the procedure well with no immediate complications. ____________________________________________  INITIAL IMPRESSION / ASSESSMENT AND PLAN / ED COURSE  Patient with a scalp cellulitis and abscess status post I&D procedure. Patient tolerates procedure well and is discharged with a prescription for Bactrim and tramadol to dose as directed. He will follow-up with his primary care provider or the local community clinics for wound check as needed. Return precautions are reviewed. ____________________________________________  FINAL CLINICAL IMPRESSION(S) / ED DIAGNOSES  Final diagnoses:  Abscess or cellulitis of scalp     Lissa Hoard, PA-C 09/01/15 1010  Sharman Cheek, MD 09/01/15 1544

## 2015-09-01 NOTE — ED Notes (Signed)
Pt presents to ED with abscess to the back of his head and to the top of his left shoulder. Pt states he first noticed painful swelling to both areas approx 3 days ago and they have gotten progressively larger and increasingly painful.

## 2015-09-01 NOTE — ED Notes (Signed)
Pt with abscess posterior neck, red, warm, painful. Not draining. C/o headache x 3 days. A/o

## 2015-09-01 NOTE — Discharge Instructions (Signed)
Cellulitis Cellulitis is an infection of the skin and the tissue under the skin. The infected area is usually red and tender. This happens most often in the arms and lower legs. HOME CARE   Take your antibiotic medicine as told. Finish the medicine even if you start to feel better.  Keep the infected arm or leg raised (elevated).  Put a warm cloth on the area up to 4 times per day.  Only take medicines as told by your doctor.  Keep all doctor visits as told. GET HELP IF:  You see red streaks on the skin coming from the infected area.  Your red area gets bigger or turns a dark color.  Your bone or joint under the infected area is painful after the skin heals.  Your infection comes back in the same area or different area.  You have a puffy (swollen) bump in the infected area.  You have new symptoms.  You have a fever. GET HELP RIGHT AWAY IF:   You feel very sleepy.  You throw up (vomit) or have watery poop (diarrhea).  You feel sick and have muscle aches and pains.   This information is not intended to replace advice given to you by your health care provider. Make sure you discuss any questions you have with your health care provider.   Document Released: 12/05/2007 Document Revised: 03/09/2015 Document Reviewed: 09/03/2011 Elsevier Interactive Patient Education 2016 [employer].  Take the antibiotic as directed. Keep the wound clean, dry, and covered as needed.

## 2015-11-19 ENCOUNTER — Encounter: Payer: Self-pay | Admitting: Emergency Medicine

## 2015-11-19 ENCOUNTER — Emergency Department
Admission: EM | Admit: 2015-11-19 | Discharge: 2015-11-19 | Disposition: A | Discharge status: Home / Self Care | Payer: Self-pay | Attending: Emergency Medicine | Admitting: Emergency Medicine

## 2015-11-19 DIAGNOSIS — L0201 Cutaneous abscess of face: Secondary | ICD-10-CM | POA: Insufficient documentation

## 2015-11-19 DIAGNOSIS — Z87891 Personal history of nicotine dependence: Secondary | ICD-10-CM | POA: Insufficient documentation

## 2015-11-19 MED ORDER — SULFAMETHOXAZOLE-TRIMETHOPRIM 800-160 MG PO TABS: 1.0000 | ORAL_TABLET | Freq: Two times a day (BID) | ORAL | Status: AC

## 2015-11-19 MED ORDER — OXYCODONE HCL 5 MG PO TABS: 5.0000 mg | ORAL_TABLET | Freq: Three times a day (TID) | ORAL | Status: AC | PRN

## 2015-11-19 NOTE — Discharge Instructions (Signed)
Abscess °An abscess is an infected area that contains a collection of pus and debris. It can occur in almost any part of the body. An abscess is also known as a furuncle or boil. °CAUSES  °An abscess occurs when tissue gets infected. This can occur from blockage of oil or sweat glands, infection of hair follicles, or a minor injury to the skin. As the body tries to fight the infection, pus collects in the area and creates pressure under the skin. This pressure causes pain. People with weakened immune systems have difficulty fighting infections and get certain abscesses more often.  °SYMPTOMS °Usually an abscess develops on the skin and becomes a painful mass that is red, warm, and tender. If the abscess forms under the skin, you may feel a moveable soft area under the skin. Some abscesses break open (rupture) on their own, but most will continue to get worse without care. The infection can spread deeper into the body and eventually into the bloodstream, causing you to feel ill.  °DIAGNOSIS  °Your caregiver will take your medical history and perform a physical exam. A sample of fluid may also be taken from the abscess to determine what is causing your infection. °TREATMENT  °Your caregiver may prescribe antibiotic medicines to fight the infection. However, taking antibiotics alone usually does not cure an abscess. Your caregiver may need to make a small cut (incision) in the abscess to drain the pus. In some cases, gauze is packed into the abscess to reduce pain and to continue draining the area. °HOME CARE INSTRUCTIONS  °· Only take over-the-counter or prescription medicines for pain, discomfort, or fever as directed by your caregiver. °· If you were prescribed antibiotics, take them as directed. Finish them even if you start to feel better. °· If gauze is used, follow your caregiver's directions for changing the gauze. °· To avoid spreading the infection: °· Keep your draining abscess covered with a  bandage. °· Wash your hands well. °· Do not share personal care items, towels, or whirlpools with others. °· Avoid skin contact with others. °· Keep your skin and clothes clean around the abscess. °· Keep all follow-up appointments as directed by your caregiver. °SEEK MEDICAL CARE IF:  °· You have increased pain, swelling, redness, fluid drainage, or bleeding. °· You have muscle aches, chills, or a general ill feeling. °· You have a fever. °MAKE SURE YOU:  °· Understand these instructions. °· Will watch your condition. °· Will get help right away if you are not doing well or get worse. °  °This information is not intended to replace advice given to you by your health care provider. Make sure you discuss any questions you have with your health care provider. °  °Document Released: 03/28/2005 Document Revised: 12/18/2011 Document Reviewed: 08/31/2011 °Elsevier Interactive Patient Education ©2016 [employer] ° °Incision and Drainage °Incision and drainage is a procedure in which a sac-like structure (cystic structure) is opened and drained. The area to be drained usually contains material such as pus, fluid, or blood.  °LET YOUR CAREGIVER KNOW ABOUT:  °· Allergies to medicine. °· Medicines taken, including vitamins, herbs, eyedrops, over-the-counter medicines, and creams. °· Use of steroids (by mouth or creams). °· Previous problems with anesthetics or numbing medicines. °· History of bleeding problems or blood clots. °· Previous surgery. °· Other health problems, including diabetes and kidney problems. °· Possibility of pregnancy, if this applies. °RISKS AND COMPLICATIONS °· Pain. °· Bleeding. °· Scarring. °· Infection. °BEFORE THE PROCEDURE  °  You may need to have an ultrasound or other imaging tests to see how large or deep your cystic structure is. Blood tests may also be used to determine if you have an infection or how severe the infection is. You may need to have a tetanus shot. °PROCEDURE  °The affected area  is cleaned with a cleaning fluid. The cyst area will then be numbed with a medicine (local anesthetic). A small incision will be made in the cystic structure. A syringe or catheter may be used to drain the contents of the cystic structure, or the contents may be squeezed out. The area will then be flushed with a cleansing solution. After cleansing the area, it is often gently packed with a gauze or another wound dressing. Once it is packed, it will be covered with gauze and tape or some other type of wound dressing.  °AFTER THE PROCEDURE  °· Often, you will be allowed to go home right after the procedure. °· You may be given antibiotic medicine to prevent or heal an infection. °· If the area was packed with gauze or some other wound dressing, you will likely need to come back in 1 to 2 days to get it removed. °· The area should heal in about 14 days. °  °This information is not intended to replace advice given to you by your health care provider. Make sure you discuss any questions you have with your health care provider. °  °Document Released: 12/12/2000 Document Revised: 12/18/2011 Document Reviewed: 08/13/2011 °Elsevier Interactive Patient Education ©2016 Elsevier Inc. ° °

## 2015-11-19 NOTE — ED Provider Notes (Signed)
[facility] Emergency Department Provider Note  ____________________________________________  Time seen: Approximately 1:02 PM  I have reviewed the triage vital signs and the nursing notes.   HISTORY  Chief Complaint Abscess    HPI Joseph Hayden is a 30 y.o. male presents for evaluation of an abscess on his chin and his back.   History reviewed. No pertinent past medical history.  Patient Active Problem List   Diagnosis Date Noted  . Vitamin D deficiency 02/03/2015  . ETD (eustachian tube dysfunction) 02/03/2015  . Elevated ALT measurement 02/03/2015  . Chronic low back pain 02/03/2015  . Chronic pain of right knee 02/03/2015    Past Surgical History  Procedure Laterality Date  . Orbital fracture surgery Left     metal plate    Current Outpatient Rx  Name  Route  Sig  Dispense  Refill  . Cholecalciferol (VITAMIN D3) 5000 UNITS TABS   Oral   Take 1 tablet by mouth daily.         . GuaiFENesin (HERBAL EXPEC PO)   Oral   Take by mouth.         . oxyCODONE (ROXICODONE) 5 MG immediate release tablet   Oral   Take 1 tablet (5 mg total) by mouth every 8 (eight) hours as needed.   12 tablet   0   . sulfamethoxazole-trimethoprim (BACTRIM DS,SEPTRA DS) 800-160 MG tablet   Oral   Take 1 tablet by mouth 2 (two) times daily.   20 tablet   0   . Turmeric POWD   Oral   Take 1 tablet by mouth daily.           Allergies Vicodin  History reviewed. No pertinent family history.  Social History Social History  Substance Use Topics  . Smoking status: Former Smoker    Quit date: 10/03/2013  . Smokeless tobacco: None  . Alcohol Use: No    Review of Systems Constitutional: No fever/chills Eyes: No visual changes. ENT: No sore throat. Cardiovascular: Denies chest pain. Respiratory: Denies shortness of breath. Gastrointestinal: No abdominal pain.  No nausea, no vomiting.  No diarrhea.  No constipation. Genitourinary: Negative  for dysuria. Musculoskeletal: Negative for back pain. Skin: Positive for 1 cm pustular lesion on the underside of his chin.. Neurological: Negative for headaches, focal weakness or numbness.  10-point ROS otherwise negative.  ____________________________________________   PHYSICAL EXAM:  VITAL SIGNS: ED Triage Vitals  Enc Vitals Group     BP 11/19/15 1248 141/75 mmHg     Pulse Rate 11/19/15 1248 68     Resp 11/19/15 1248 18     Temp 11/19/15 1248 98.1 F (36.7 C)     Temp Source 11/19/15 1248 Oral     SpO2 11/19/15 1248 98 %     Weight 11/19/15 1248 220 lb (99.791 kg)     Height 11/19/15 1248  (1.88 m)     Head Cir --      Peak Flow --      Pain Score 11/19/15 1249 7     Pain Loc --      Pain Edu? --      Excl. in GC? --     Constitutional: Alert and oriented. Well appearing and in no acute distress. Neck: No stridor.   Neurologic:  Normal speech and language. No gross focal neurologic deficits are appreciated. No gait instability. Skin:  Skin is warm, dry and intact. Symmetric pustular lesion noted on the underside of the  chin. No erythema tender tender nodule approximately 2 cm in diameter. Psychiatric: Mood and affect are normal. Speech and behavior are normal.  ____________________________________________   LABS (all labs ordered are listed, but only abnormal results are displayed)  Labs Reviewed - No data to display ____________________________________________  EKG   ____________________________________________  RADIOLOGY   ____________________________________________   PROCEDURES  Procedure(s) performed: yes  INCISION AND DRAINAGE Performed by: Evangeline DakinBEERS, CHARLES M Consent: Verbal consent obtained. Risks and benefits: risks, benefits and alternatives were discussed Type: abscess  Body area: chin  Anesthesia: local infiltration  Incision was made with a scalpel.  Local anesthetic: none  Anesthetic total: 0 ml  Complexity:  complex Blunt dissection to break up loculations  Drainage: purulent  Drainage amount: scant  Packing material: none  Patient tolerance: Patient tolerated the procedure well with no immediate complications.    Critical Care performed: No  ____________________________________________   INITIAL IMPRESSION / ASSESSMENT AND PLAN / ED COURSE  Pertinent labs & imaging results that were available during my care of the patient were reviewed by me and considered in my medical decision making (see chart for details).   Minor abscess on  chin. Rx given for Bactrim DS twice a day Percocet 5/325. Patient follow-up PCP or return to ER with any worsening symptomology. ____________________________________________   FINAL CLINICAL IMPRESSION(S) / ED DIAGNOSES  Final diagnoses:  Abscess of chin     This chart was dictated using voice recognition software/Dragon. Despite best efforts to proofread, errors can occur which can change the meaning. Any change was purely unintentional.   Evangeline Dakinharles M Beers, PA-C 11/19/15 1551  Arnaldo NatalPaul F Malinda, MD 11/19/15 [phone]

## 2015-11-19 NOTE — ED Notes (Signed)
Pt noticed spot on neck under chin and on back in the middle.  Pt states he tried to pop them but would not pop.

## 2016-01-11 ENCOUNTER — Encounter: Payer: Self-pay | Admitting: Emergency Medicine

## 2016-01-11 ENCOUNTER — Emergency Department: Payer: Self-pay

## 2016-01-11 ENCOUNTER — Emergency Department
Admission: EM | Admit: 2016-01-11 | Discharge: 2016-01-11 | Disposition: A | Discharge status: Home / Self Care | Payer: Self-pay | Attending: Student | Admitting: Student

## 2016-01-11 DIAGNOSIS — W010XXA Fall on same level from slipping, tripping and stumbling without subsequent striking against object, initial encounter: Secondary | ICD-10-CM | POA: Insufficient documentation

## 2016-01-11 DIAGNOSIS — Y929 Unspecified place or not applicable: Secondary | ICD-10-CM | POA: Insufficient documentation

## 2016-01-11 DIAGNOSIS — Y990 Civilian activity done for income or pay: Secondary | ICD-10-CM | POA: Insufficient documentation

## 2016-01-11 DIAGNOSIS — Y939 Activity, unspecified: Secondary | ICD-10-CM | POA: Insufficient documentation

## 2016-01-11 DIAGNOSIS — Z792 Long term (current) use of antibiotics: Secondary | ICD-10-CM | POA: Insufficient documentation

## 2016-01-11 DIAGNOSIS — M25562 Pain in left knee: Secondary | ICD-10-CM | POA: Insufficient documentation

## 2016-01-11 DIAGNOSIS — Z79899 Other long term (current) drug therapy: Secondary | ICD-10-CM | POA: Insufficient documentation

## 2016-01-11 DIAGNOSIS — Z87891 Personal history of nicotine dependence: Secondary | ICD-10-CM | POA: Insufficient documentation

## 2016-01-11 MED ORDER — OXYCODONE HCL 5 MG PO TABS
5.0000 mg | ORAL_TABLET | Freq: Once | ORAL | Status: AC
  Administered 2016-01-11: 5 mg via ORAL
  Filled 2016-01-11: qty 1

## 2016-01-11 MED ORDER — IBUPROFEN 600 MG PO TABS: 600.0000 mg | ORAL_TABLET | Freq: Four times a day (QID) | ORAL | Status: AC | PRN

## 2016-01-11 MED ORDER — OXYCODONE HCL 5 MG PO TABS: 5.0000 mg | ORAL_TABLET | Freq: Four times a day (QID) | ORAL | Status: AC | PRN

## 2016-01-11 NOTE — ED Notes (Signed)
Pt presents to ED with c/o LEFT knee pain, reports slipped and twisted his LEFT leg at work. Pt alert and oriented x 4, no increased work in breathing noted. No obvious swelling or deformity noted.

## 2016-01-11 NOTE — ED Notes (Signed)
Patient states he does not want to file workman comp for injury.

## 2016-01-11 NOTE — ED Notes (Addendum)
Pt with posterior tibialis pulse of left leg with doppler.

## 2016-01-11 NOTE — ED Provider Notes (Signed)
[facility] Emergency Department Provider Note   ____________________________________________  Time seen: Approximately 7:02 AM  I have reviewed the triage vital signs and the nursing notes.   HISTORY  Chief Complaint Knee Injury    HPI Joseph Hayden is a 30 y.o. male with history of chronic back pain, history of prior left knee strain last year initially seen by orthopedic surgery in 01/2015 told to follow up for an MRI for possible ligamentous injury (though he never did)  who presents for evaluation of traumatic left knee pain that began last night after he slipped and fell at work onto a flexed knee, pain has been constant in onset, worse with movement and weightbearing, currently moderate. Patient reports that there was a wet spot on the floor at work yesterday and he wasn't paying attention, he slipped on it fell onto the left knee and has had pain since. No head injury or loss of consciousness, no other pain complaints. No chest pain or difficulty breathing, no vomiting, diarrhea, fevers or chills. He has otherwise been in his usual state of health.   History reviewed. No pertinent past medical history.  Patient Active Problem List   Diagnosis Date Noted  . Vitamin D deficiency 02/03/2015  . ETD (eustachian tube dysfunction) 02/03/2015  . Elevated ALT measurement 02/03/2015  . Chronic low back pain 02/03/2015  . Chronic pain of right knee 02/03/2015    Past Surgical History  Procedure Laterality Date  . Orbital fracture surgery Left     metal plate    Current Outpatient Rx  Name  Route  Sig  Dispense  Refill  . Cholecalciferol (VITAMIN D3) 5000 UNITS TABS   Oral   Take 1 tablet by mouth daily.         . GuaiFENesin (HERBAL EXPEC PO)   Oral   Take by mouth.         Marland Kitchen ibuprofen (ADVIL,MOTRIN) 600 MG tablet   Oral   Take 1 tablet (600 mg total) by mouth every 6 (six) hours as needed for moderate pain.   15 tablet   0   .  oxyCODONE (ROXICODONE) 5 MG immediate release tablet   Oral   Take 1 tablet (5 mg total) by mouth every 8 (eight) hours as needed.   12 tablet   0   . oxyCODONE (ROXICODONE) 5 MG immediate release tablet   Oral   Take 1 tablet (5 mg total) by mouth every 6 (six) hours as needed for breakthrough pain (Take this medication only if you're having severe pain despite taking ibuprofen as prescribed. Do not drive while taking this medication.). Do not drive while taking this medication.   6 tablet   0   . sulfamethoxazole-trimethoprim (BACTRIM DS,SEPTRA DS) 800-160 MG tablet   Oral   Take 1 tablet by mouth 2 (two) times daily.   20 tablet   0   . Turmeric POWD   Oral   Take 1 tablet by mouth daily.           Allergies Vicodin  No family history on file.  Social History Social History  Substance Use Topics  . Smoking status: Former Smoker    Quit date: 10/03/2013  . Smokeless tobacco: None  . Alcohol Use: 3.6 oz/week    6 Standard drinks or equivalent per week     Comment: occa    Review of Systems Constitutional: No fever/chills Eyes: No visual changes. ENT: No sore throat. Cardiovascular: Denies chest  pain. Respiratory: Denies shortness of breath. Gastrointestinal: No abdominal pain.  No nausea, no vomiting.  No diarrhea.  No constipation. Genitourinary: Negative for dysuria. Musculoskeletal: Negative for back pain. Skin: Negative for rash. Neurological: Negative for headaches, focal weakness or numbness.  10-point ROS otherwise negative.  ____________________________________________   PHYSICAL EXAM:  VITAL SIGNS: ED Triage Vitals  Enc Vitals Group     BP 01/11/16 0149 111/88 mmHg     Pulse Rate 01/11/16 0149 93     Resp 01/11/16 0149 18     Temp 01/11/16 0149 98.1 F (36.7 C)     Temp Source 01/11/16 0149 Oral     SpO2 01/11/16 0149 99 %     Weight 01/11/16 0149 200 lb (90.719 kg)     Height 01/11/16 0149 6\' 2"  (1.88 m)     Head Cir --      Peak  Flow --      Pain Score 01/11/16 0150 6     Pain Loc --      Pain Edu? --      Excl. in GC? --     Constitutional: Sleeping but awakens to voice and light touch and is Alert and oriented, appropriate. Well appearing and in no acute distress. Eyes: Conjunctivae are normal. PERRL. EOMI. Head: Atraumatic. Nose: No congestion/rhinnorhea. Mouth/Throat: Mucous membranes are moist.  Oropharynx non-erythematous. Neck: No stridor.  No midline C-spine tenderness to palpation. Cardiovascular: Normal rate, regular rhythm. Grossly normal heart sounds.  Good peripheral circulation. Respiratory: Normal respiratory effort.  No retractions. Lungs CTAB. Gastrointestinal: Soft and nontender. No distention. No CVA tenderness. Genitourinary: deferred Musculoskeletal: No obvious swelling or deformity of the left knee however full passive flexion and extension are painful, the patient does have intact extensor mechanism, dopplered PT pulse bilaterally, both feet warm and well-perfused with brisk capillary refill in the toes, compartments soft throughout the leg. No gross laxity of the knee joint. Neurologic:  Normal speech and language. No gross focal neurologic deficits are appreciated.  Skin:  Skin is warm, dry and intact. No rash noted. Psychiatric: Mood and affect are normal. Speech and behavior are normal.  ____________________________________________   LABS (all labs ordered are listed, but only abnormal results are displayed)  Labs Reviewed - No data to display ____________________________________________  EKG  none ____________________________________________  RADIOLOGY  Xray left knee FINDINGS: Bone mineralization normal.  Joint spaces preserved.  No fracture, dislocation, or bone destruction.  No joint effusion.  IMPRESSION: Normal exam. ____________________________________________   PROCEDURES  Procedure(s) performed: None  Procedures  Critical Care performed:  No  ____________________________________________   INITIAL IMPRESSION / ASSESSMENT AND PLAN / ED COURSE  Pertinent labs & imaging results that were available during my care of the patient were reviewed by me and considered in my medical decision making (see chart for details).  Joseph Hayden is a 30 y.o. male with history of chronic back pain, left patellar fracture in 2016 but no surgical management for that fracture who presents for evaluation of traumatic left knee pain that began last night after he slipped and fell at work. On exam, he is sleeping but awakens to voice and light touch, answers all questions appropriately and is not in any pain. He is neurovascularly intact in the left leg, has pain with range of motion of the left knee but has good range of motion and an intact extensor mechanism. Likely sprain. I doubt any acute fracture or dislocation however We'll obtain plain films, treat his pain, reassess  for disposition.  ----------------------------------------- 8:15 AM on 01/11/2016 ----------------------------------------- Patient continues to rest comfortably, plain films negative. Still neurologically intact in the left knee. We'll DC with crutches, pain control, close orthopedic surgery follow-up. We discussed return precautions and he is comfortable with the discharge plan. DC home. ____________________________________________   FINAL CLINICAL IMPRESSION(S) / ED DIAGNOSES  Final diagnoses:  Knee pain, acute, left      NEW MEDICATIONS STARTED DURING THIS VISIT:  New Prescriptions   IBUPROFEN (ADVIL,MOTRIN) 600 MG TABLET    Take 1 tablet (600 mg total) by mouth every 6 (six) hours as needed for moderate pain.   OXYCODONE (ROXICODONE) 5 MG IMMEDIATE RELEASE TABLET    Take 1 tablet (5 mg total) by mouth every 6 (six) hours as needed for breakthrough pain (Take this medication only if you're having severe pain despite taking ibuprofen as prescribed. Do not drive while  taking this medication.). Do not drive while taking this medication.     Note:  This document was prepared using Dragon voice recognition software and may include unintentional dictation errors.    Gayla Doss, MD 01/11/16 [id number]

## 2017-04-18 IMAGING — CR DG KNEE COMPLETE 4+V*L*
4 series · 4 of 4 positions shown · non-contrast
Comparison: None

CLINICAL DATA: Twist injury LEFT knee 1 day ago, heard a pop, now
with diffuse pain and swelling, history of multiple torn ligaments
and patellar fracture 1 year ago

EXAM:
LEFT KNEE - COMPLETE 4+ VIEW

[knee ap]
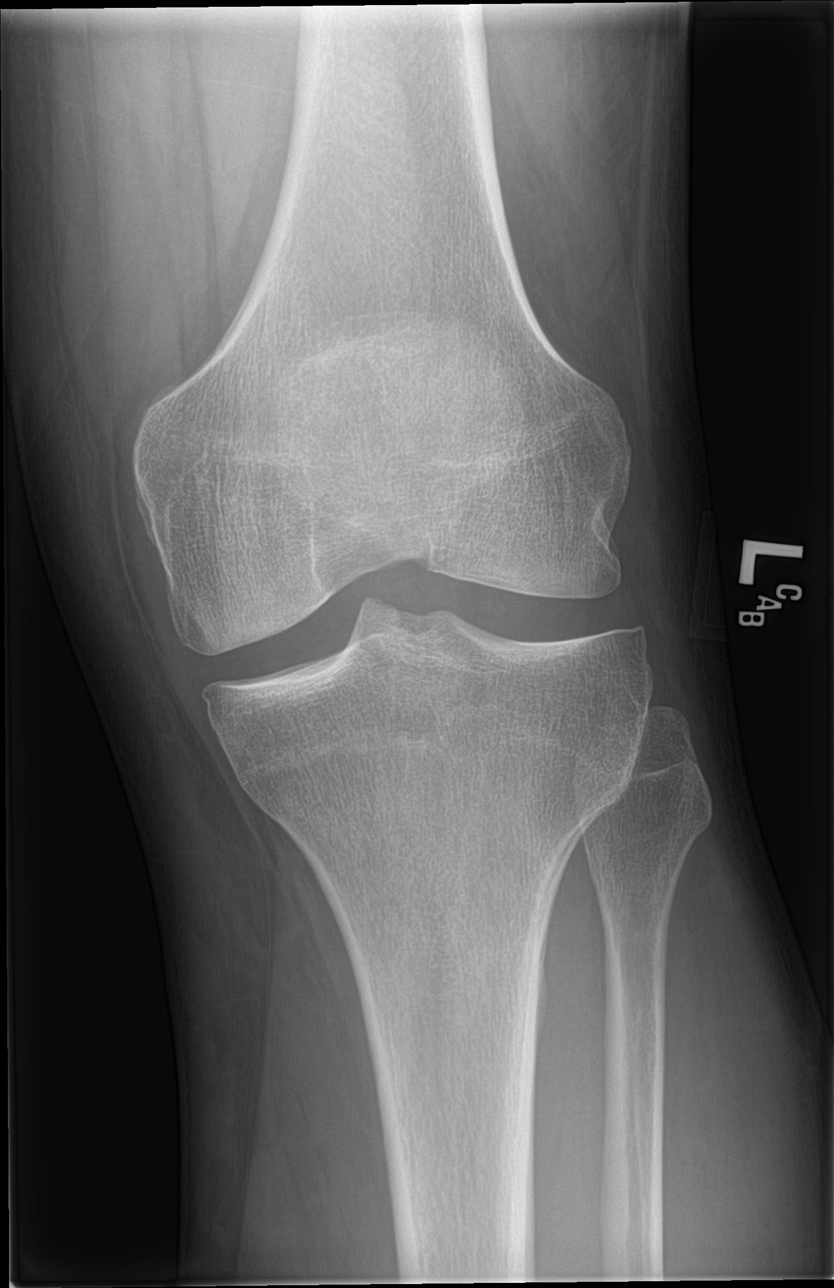

[knee obl (1 of 2)]
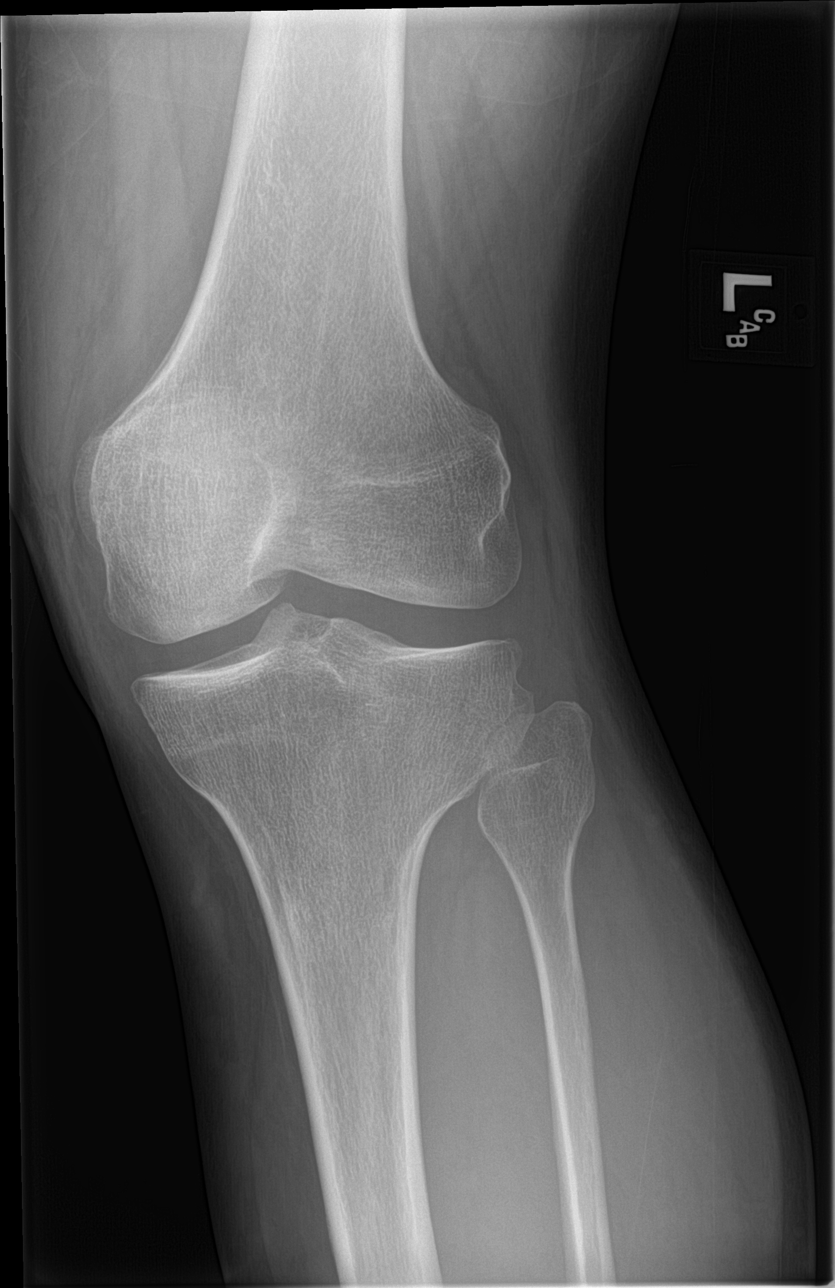

[knee obl (2 of 2)]
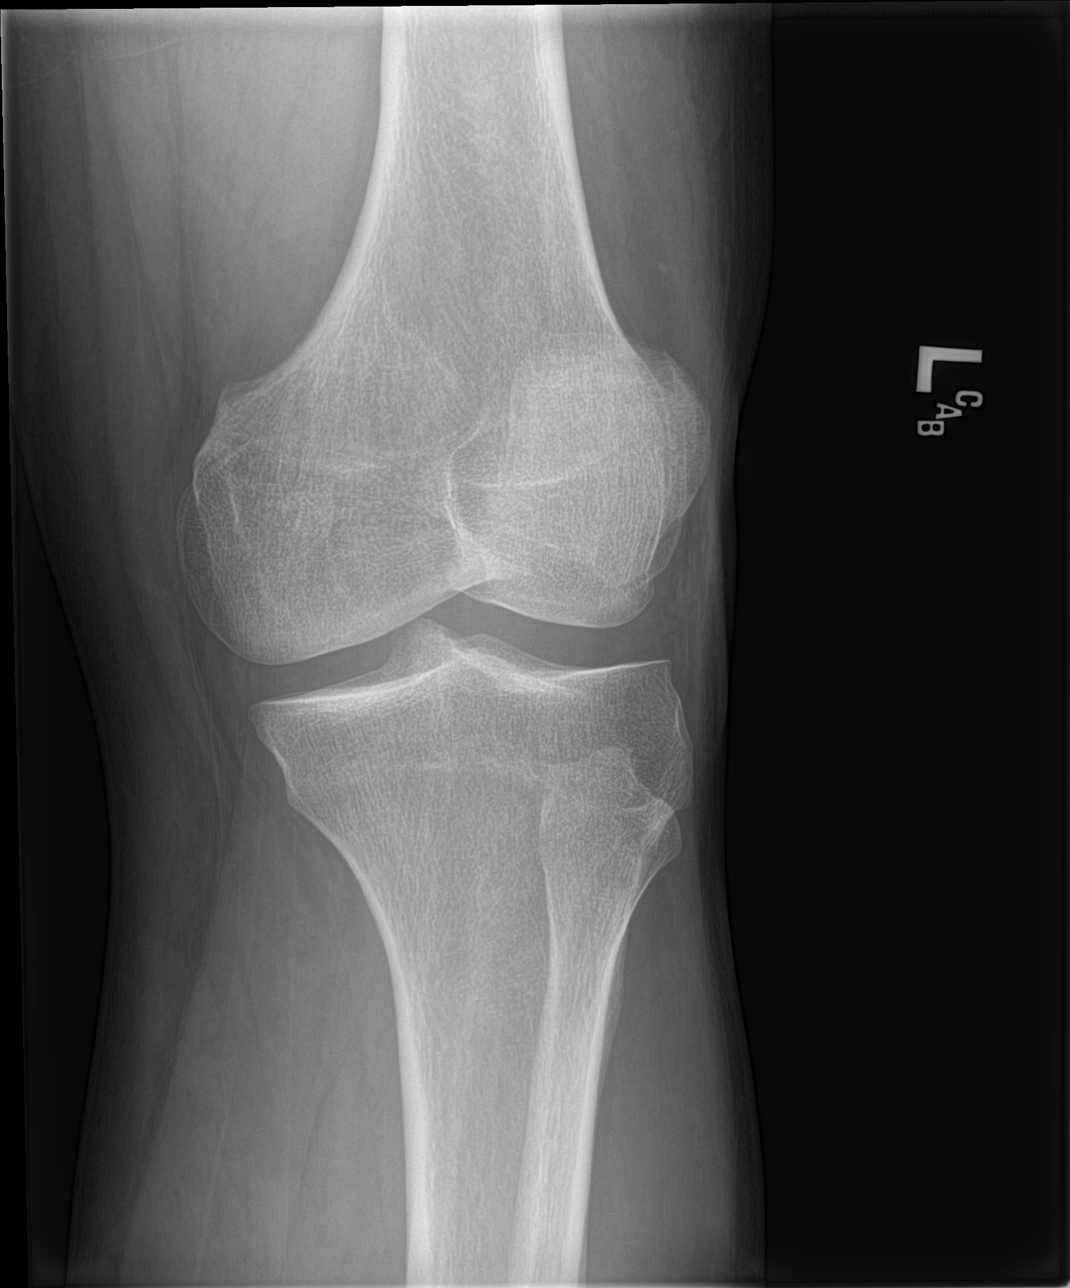

[knee lat]
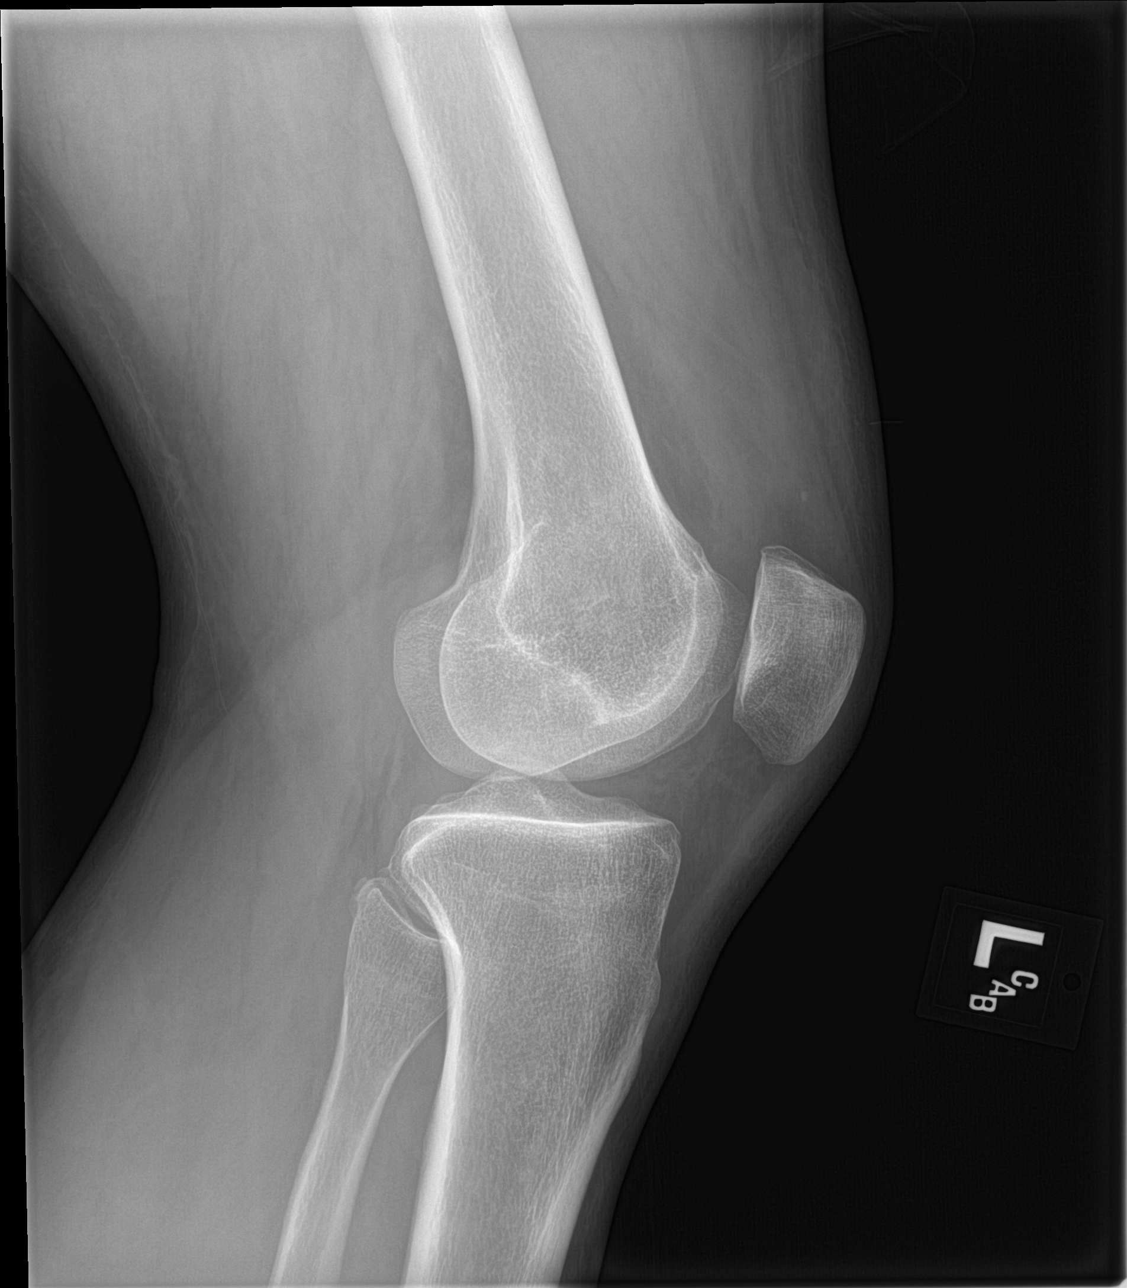

[4 of 4 positions shown; findings below may reference images not displayed]

FINDINGS: Bone mineralization normal.

Joint spaces preserved.

No fracture, dislocation, or bone destruction.

No joint effusion.
IMPRESSION: Normal exam.
